# Patient Record
Sex: Female | Born: 1995 | Race: White | Hispanic: No | Marital: Single
Health system: Southern US, Community
[De-identification: ages and names within clinical notes are randomized; demographics above are authoritative.]

---
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
## 2018-08-25 ENCOUNTER — Other Ambulatory Visit: Payer: Self-pay

## 2018-08-25 ENCOUNTER — Ambulatory Visit: Payer: Medicaid Other | Attending: Urology | Admitting: Physical Therapy

## 2018-08-25 DIAGNOSIS — M6281 Muscle weakness (generalized): Secondary | ICD-10-CM | POA: Insufficient documentation

## 2018-08-25 DIAGNOSIS — R279 Unspecified lack of coordination: Secondary | ICD-10-CM | POA: Diagnosis present

## 2018-08-25 DIAGNOSIS — R252 Cramp and spasm: Secondary | ICD-10-CM | POA: Insufficient documentation

## 2018-08-25 NOTE — Therapy (Addendum)
[facility] [address], [address] [place], [place], [address] Phone: [phone]   Fax:  [phone]  Physical Therapy Evaluation  Patient Details  Name: Elaine Hebert MRN: [id number] Date of Birth: 05/03/1995 Referring Provider (PT): Crist FatHerrick, Benjamin W, MD   Encounter Date: 08/25/2018 PT End of Session - 08/25/18 0944    Visit Number  1    Date for PT Re-Evaluation  11/17/18    Authorization Type  MCAID    PT Start Time  0930    PT Stop Time  1012    PT Time Calculation (min)  42 min    Activity Tolerance  Patient tolerated treatment well;Patient limited by pain    Behavior During Therapy  [facility] for tasks assessed/performed         History reviewed. No pertinent past medical history.  History reviewed. No pertinent surgical history.  There were no vitals filed for this visit.  Subjective Assessment - 08/25/18 0934    Subjective  Pt has had pain in the urethra since the beginning of this year.  It has been worse with heat and in the summer.  had to stop working at [employer].  I always have to pee. Pt reports she feels like she has been under a lot of stress with a special needs daughter and her daugter's father committed suicide last year. She reports that she realizes this may be making her pain worse as well.   Currently in Pain?  Yes    Pain Score  3    up to 9/10 crying   Pain Location  Pelvis    Pain Orientation  Mid    Pain Descriptors / Indicators  Burning    Pain Type  Chronic pain    Pain Radiating Towards  urethra    Pain Onset  More than a month ago    Pain Frequency  Intermittent    Aggravating Factors   heat and running around after special needs daughter, soda    Effect of Pain on Daily Activities  sometimes can only lie down    Multiple Pain Sites  No      Patient Goals: stop having pain and be able to work and be intimate with her partner     [facility] PT Assessment - 08/28/18 0001      Assessment   Medical Diagnosis  R30.0 (ICD-10-CM) - Dysuria; R10.2 (ICD-10-CM) - Pelvic and perineal pain    Referring Provider (PT)  Crist FatHerrick, Benjamin W, MD    Onset Date/Surgical Date  --   early this year   Prior Therapy  No      Precautions   Precautions  None      Restrictions   Weight Bearing Restrictions  No      Balance Screen   Has the patient fallen in the past 6 months  No      Home Environment   Living Environment  Private residence    Living Arrangements  Spouse/significant other;Children   daughter 2320 mos old     Prior Function   Level of Independence  Independent    Vocation  Unemployed      Cognition   Overall Cognitive Status  Within Functional Limits for tasks assessed      Observation/Other Assessments   Observations  pt seems anxious; talking a lot and hard to keep on track      Posture/Postural Control   Posture/Postural Control  Postural limitations    Postural  Limitations  Anterior pelvic tilt      ROM / Strength   AROM / PROM / Strength  AROM;Strength      AROM   Overall AROM Comments  lumbar AROM normal      Strength   Overall Strength Comments  bilateral hip strength 4+/5 to 5/5 throughout      Flexibility   Soft Tissue Assessment /Muscle Length  yes    Hamstrings  60% bilateral      Palpation   Palpation comment  lumbar paraspinals tight; some weakness of lower abdomen decreased tension when asked to lift head and neck      Special Tests    Special Tests  Lumbar    Lumbar Tests  Straight Leg Raise      Straight Leg Raise   Findings  Negative      Ambulation/Gait   Gait Pattern  Within Functional Limits                Objective measurements completed on examination: See above findings.    Pelvic Floor Special Questions - 08/28/18 0001    Prior Pelvic/Prostate Exam  Yes    Are you Pregnant or attempting pregnancy?  No    Prior Pregnancies  Yes    Currently Sexually Active  Yes    Is this Painful  Yes    Marinoff  Scale  pain prevents any attempts at intercourse   50% less than I would want to    Urinary Leakage  Yes    Activities that cause leaking  --   drops after voiding   Urinary urgency  Yes    Urinary frequency  sometimes every 30 min when it is painful; denies nocturia    Fecal incontinence  --   constipation   Falling out feeling (prolapse)  No    Skin Integrity  --   bleeding noted - pt reports she is on her period   Perineal Body/Introitus   Normal    Prolapse  None    Pelvic Floor Internal Exam  pt identity confirmed and informed consent given to perform internal soft tissue assessment    Exam Type  Vaginal    Sensation  norm    Palpation  slightly more tender to pubococcygeus anterior attachments    Strength  good squeeze, good lift, able to hold agaisnt strong resistance    Strength # of reps  3    Strength # of seconds  4    Tone  high               PT Education - 08/28/18 1335    Education Details  educated on breathing and you tube - pelvic floor meditation    Person(s) Educated  Patient    Methods  Explanation    Comprehension  Verbalized understanding       PT Short Term Goals - 08/28/18 1400      PT SHORT TERM GOAL #1   Title  ind with toileting techniques    Time  3    Period  Weeks    Status  New    Target Date  09/15/18      PT SHORT TERM GOAL #2   Title  ind with initial HEP    Time  3    Period  Weeks    Status  New    Target Date  09/15/18        PT Long Term Goals - 08/28/18 1336  PT LONG TERM GOAL #1   Title  Pt will be ind with HEP in order to be able to manage symptoms at home    Baseline  does not know    Time  12    Period  Weeks    Status  New    Target Date  11/17/18      PT LONG TERM GOAL #2   Title  Pt will report mild pain of 1-2/10 at most during all daily activities    Baseline  up to 9/10    Time  12    Period  Weeks    Status  New    Target Date  11/17/18      PT LONG TERM GOAL #3   Title  Pt will be  able to relax and bulge pelvic floor along with understanding toilet techniques in order to void without increased pain    Baseline  burning with urnination and high tone pelvic floor unable to bulge    Time  12    Period  Weeks    Status  New    Target Date  11/17/18      PT LONG TERM GOAL #4   Title  Pt will report 1/3 Marinoff scale for improved functional activities    Baseline  3/3 on the [facility] scale    Time  12    Period  Weeks    Status  New    Target Date  11/17/18           Plan - 08/25/18 0951    Clinical Impression Statement  Pt presents to clnic due to pelvic pain of the urethra and feeling chronic sensations of UTI.  Pt mentioned a lot of stress in her life currently with a special needs child, recently having to quit her job due to pain, and her child's father committing suicide.  Pt states she can tell part of her problem is due to stress.  Pt has increased anterior hip rotation.  She has pelvic floor strength of 4/5 MMT but low endurance of 4 seconds.  Pt has high tone and unable to relax and bulge pelvic floor muscles. Pt has tension throughout LE with decreaesd hamstring flexibility and tight lumbar paraspinals. Pt has some core weakness with easier time performing straight leg raise when stabilizing pelvis. she is unable to activate her TrA enough to stabilize the pelvis when performing a straight leg raise. Pt will benefit from skilled PT to downtrain her pelvic floor and improve overall strength and posture as well as coordination needed for maximum functional activities.   Examination-Activity Limitations  Caring for Others;Continence    Stability/Clinical Decision Making  Evolving/Moderate complexity    Clinical Decision Making  Moderate    Rehab Potential  Good    PT Frequency  1x / week    PT Duration  12 weeks      Examination-Participation Restrictions -  Pt will benefit from skilled therapeutic intervention in order to improve on the following deficits  Increased muscle spasms; Decreased coordination; Decreased range of motion; Impaired flexibility; Postural dysfunction  Stability/Clinical Decision Making Evolving/Moderate complexity  Clinical Decision Making Moderate  Rehab Potential Good  PT Frequency 1x / week  PT Duration 12 weeks  PT Treatment/Interventions ADLs/Self Care Home Management; Biofeedback; Cryotherapy; Electrical Stimulation; Moist Heat; Therapeutic activities; Therapeutic exercise; Neuromuscular re-education; Patient/family education; Manual techniques; Taping; Dry needling; Passive range of motion  PT Next Visit Plan breathing and bulging pelvic floor, hip,  lumbar hamstring stretch, initiate HEP, toileting tequniques abdominal massage, biofeedback  PT Home Exercise Plan -  Recommended Other Services -  Consulted and Agree with Plan of Care Patient         Patient will benefit from skilled therapeutic intervention in order to improve the following deficits and impairments:  Increased muscle spasms, Decreased coordination, Decreased range of motion, Impaired flexibility, Postural dysfunction  Visit Diagnosis: 1. Cramp and spasm   2. Unspecified lack of coordination   3. Muscle weakness (generalized)        Problem List Patient Active Problem List   Diagnosis Date Noted  . [redacted] weeks gestation of pregnancy   . Abnormal fetal ultrasound   . Encounter for fetal anatomic survey   . Pregnancy complicated by fetal neurologic abnormality   . Poor fetal growth affecting management of mother in third trimester     Junious SilkJakki L Sinclair Arrazola, PT 08/28/2018, 2:13 PM  [facility] [address], [address] [place], [place], [address] Phone: [phone]   Fax:  [phone]  Name: Elaine Hebert MRN: [id number] Date of Birth: 02/16/1995

## 2018-08-28 ENCOUNTER — Encounter: Payer: Self-pay | Admitting: Physical Therapy

## 2018-08-28 NOTE — Addendum Note (Signed)
Addended by: Su Hoff on: 08/28/2018 02:16 PM   Modules accepted: Orders

## 2018-09-01 ENCOUNTER — Ambulatory Visit: Payer: Medicaid Other | Admitting: Physical Therapy

## 2018-09-01 ENCOUNTER — Other Ambulatory Visit: Payer: Self-pay

## 2018-09-01 DIAGNOSIS — M6281 Muscle weakness (generalized): Secondary | ICD-10-CM

## 2018-09-01 DIAGNOSIS — R279 Unspecified lack of coordination: Secondary | ICD-10-CM

## 2018-09-01 DIAGNOSIS — R252 Cramp and spasm: Secondary | ICD-10-CM | POA: Diagnosis not present

## 2018-09-01 NOTE — Therapy (Signed)
[facility] [address], [place], [place], [address] Phone: [phone]   Fax:  [phone]  Physical Therapy Treatment  Patient Details  Name: Elaine Hebert MRN: [id number] Date of Birth: [birth year] Referring Provider (PT): Ardis Hughs, MD   Encounter Date: 09/01/2018  PT End of Session - 09/01/18 1029    Visit Number  2    Date for PT Re-Evaluation  11/17/18    Authorization Type  MCAID    PT Start Time  1025   pt arrived late   PT Stop Time  1105    PT Time Calculation (min)  40 min    Activity Tolerance  Patient tolerated treatment well;Patient limited by pain    Behavior During Therapy  [facility] for tasks assessed/performed       No past medical history on file.  No past surgical history on file.  There were no vitals filed for this visit.  Subjective Assessment - 09/01/18 1030    Subjective  Pt states she has been using only mobic and valume for pain relief.    Pertinent History  goes by Elaine Hebert    Limitations  Sitting    Currently in Pain?  Yes                       [name] - 09/01/18 0001      Neuro Re-ed    Neuro Re-ed Details   educated and performed diaphragmatic breathing throughout stretches      Exercises   Exercises  Lumbar      Lumbar Exercises: Stretches   Active Hamstring Stretch  Right;Left;3 reps;20 seconds    Double Knee to Chest Stretch Limitations  happy baby stretch - 2 min with breathing    Lower Trunk Rotation  5 reps    Hip Flexor Stretch Limitations  hip adduction with strap and - 3x 20 sec    Press Ups Limitations  press up to forearms - 5 x 10 sec    Piriformis Stretch  Right;Left;3 reps;30 seconds    Other Lumbar Stretch Exercise  throracic rotation sidelying - 5 x 10 sec each side             PT Education - 09/01/18 1102    Education Details  Access Code: [email or web address]    Person(s) Educated  Patient    Methods   Explanation;Demonstration;Handout;Verbal cues;Tactile cues    Comprehension  Verbalized understanding;Returned demonstration       PT Short Term Goals - 09/01/18 1112      PT SHORT TERM GOAL #1   Title  ind with toileting techniques    Status  On-going      PT SHORT TERM GOAL #2   Title  ind with initial HEP    Status  On-going        PT Long Term Goals - 08/28/18 1336      PT LONG TERM GOAL #1   Title  Pt will be ind with HEP in order to be able to manage symptoms at home    Baseline  does not know    Time  12    Period  Weeks    Status  New    Target Date  11/17/18      PT LONG TERM GOAL #2   Title  Pt will report mild pain of 1-2/10 at most during all daily activities    Baseline  up to 9/10  Time  12    Period  Weeks    Status  New    Target Date  11/17/18      PT LONG TERM GOAL #3   Title  Pt will be able to relax and bulge pelvic floor along with understanding toilet techniques in order to void without increased pain    Baseline  burning with urnination and high tone pelvic floor unable to bulge    Time  12    Period  Weeks    Status  New    Target Date  11/17/18      PT LONG TERM GOAL #4   Title  Pt will report 1/3 Marinoff scale for improved functional activities    Baseline  3/3 on the [facility] scale    Time  12    Period  Weeks    Status  New    Target Date  11/17/18            Plan - 09/01/18 1109    Clinical Impression Statement  Pt was able to perform stretches with breathing after moderate amount of cues and education on how to breath correctly.  She needed cues to breathe more slowly on the inhale.  Pt reports feeling better at the end of the treatment.  She continues to need skilled PT in order to porgress the soft tissue length so we can successfully begin strength and endurance of the pelvic floor.    PT Treatment/Interventions  ADLs/Self Care Home Management;Biofeedback;Cryotherapy;Electrical Stimulation;Moist Heat;Therapeutic  activities;Therapeutic exercise;Neuromuscular re-education;Patient/family education;Manual techniques;Taping;Dry needling;Passive range of motion    PT Next Visit Plan  internal STM and discuss self massage with wand, toileting techniques, abdominal massage    PT Home Exercise Plan  Access Code: [email or web address]    Consulted and Agree with Plan of Care  Patient       Patient will benefit from skilled therapeutic intervention in order to improve the following deficits and impairments:  Increased muscle spasms, Decreased coordination, Decreased range of motion, Impaired flexibility, Postural dysfunction  Visit Diagnosis: 1. Cramp and spasm   2. Unspecified lack of coordination   3. Muscle weakness (generalized)        Problem List Patient Active Problem List   Diagnosis Date Noted  . [redacted] weeks gestation of pregnancy   . Abnormal fetal ultrasound   . Encounter for fetal anatomic survey   . Pregnancy complicated by fetal neurologic abnormality   . Poor fetal growth affecting management of mother in third trimester     Elaine Hebert, PT 09/01/2018, 11:12 AM  [facility] [address], [address] [place], [place], [address] Phone: [phone]   Fax:  [phone]  Name: Elaine Hebert MRN: [id number] Date of Birth: 03/28/1995

## 2018-09-01 NOTE — Patient Instructions (Signed)
Access Code: [email or web address]  URL: [email or web address]  Date: 09/01/2018  Prepared by: Jari Favre   Exercises  Supine Figure 4 Piriformis Stretch - 3 reps - 1 sets - 30 sec hold - 1x daily - 7x weekly  Hooklying Hamstring Stretch with Strap - 3 reps - 1 sets - 30 sec hold - 1x daily - 7x weekly  Supine Butterfly Groin Stretch - 3 reps - 1 sets - 30 sec hold - 1x daily - 7x weekly  Hip Adductors and Hamstring Stretch with Strap - 3 reps - 1 sets - 30 sec hold - 1x daily - 7x weekly  Sidelying Thoracic Lumbar Rotation - 5 reps - 1 sets - 10 sec hold - 1x daily - 7x weekly  Supine Diaphragmatic Breathing with Pelvic Floor Lengthening - 10 reps - 1 sets - 3x daily - 7x weekly

## 2018-09-08 ENCOUNTER — Encounter: Payer: Self-pay | Admitting: Physical Therapy

## 2018-09-08 ENCOUNTER — Other Ambulatory Visit: Payer: Self-pay

## 2018-09-08 ENCOUNTER — Ambulatory Visit: Payer: Medicaid Other | Admitting: Physical Therapy

## 2018-09-08 DIAGNOSIS — R252 Cramp and spasm: Secondary | ICD-10-CM | POA: Diagnosis not present

## 2018-09-08 DIAGNOSIS — M6281 Muscle weakness (generalized): Secondary | ICD-10-CM

## 2018-09-08 DIAGNOSIS — R279 Unspecified lack of coordination: Secondary | ICD-10-CM

## 2018-09-08 NOTE — Patient Instructions (Signed)
Access Code: [email or web address]  URL: [email or web address]  Date: 09/08/2018  Prepared by: Jari Favre   Exercises  Supine Figure 4 Piriformis Stretch - 3 reps - 1 sets - 30 sec hold - 1x daily - 7x weekly  Hooklying Hamstring Stretch with Strap - 3 reps - 1 sets - 30 sec hold - 1x daily - 7x weekly  Supine Butterfly Groin Stretch - 3 reps - 1 sets - 30 sec hold - 1x daily - 7x weekly  Hip Adductors and Hamstring Stretch with Strap - 3 reps - 1 sets - 30 sec hold - 1x daily - 7x weekly  Sidelying Thoracic Lumbar Rotation - 5 reps - 1 sets - 10 sec hold - 1x daily - 7x weekly  Supine Diaphragmatic Breathing with Pelvic Floor Lengthening - 10 reps - 1 sets - 3x daily - 7x weekly  Cat-Camel to Child's Pose - 5 reps - 1 sets - 10 sec hold - 1x daily - 7x weekly  Quadruped Pelvic Floor Contraction with Weight Shift Forward Backward - 10 reps - 3 sets - 1x daily - 7x weekly

## 2018-09-08 NOTE — Therapy (Signed)
[facility] [address] [place], [place], [address] Phone: [phone]   Fax:  [phone]  Physical Therapy Treatment  Patient Details  Name: Elaine Hebert MRN: [id number] Date of Birth: Feb 27, 1995 Referring Provider (PT): Ardis Hughs, MD   Encounter Date: 09/08/2018  PT End of Session - 09/08/18 1148    Visit Number  3    Date for PT Re-Evaluation  11/17/18    Authorization Type  MCAID    Authorization - Visit Number  2    Authorization - Number of Visits  3    PT Start Time  1106    PT Stop Time  1148    PT Time Calculation (min)  42 min    Activity Tolerance  Patient tolerated treatment well    Behavior During Therapy  [facility] for tasks assessed/performed       History reviewed. No pertinent past medical history.  History reviewed. No pertinent surgical history.  There were no vitals filed for this visit.  Subjective Assessment - 09/08/18 1108    Subjective  I feel about the same since the last time.  Lately pain hasn't been happening during the day.  Pain when using the bathroom, that still hurts really bad sometimes.  When I leave here I feel better and I can empty my bladder more    Pertinent History  goes by Elaine Hebert    Patient Stated Goals  stop having pain and be able to work and be intimate with her partner    Currently in Pain?  No/denies                       [facility] Adult PT Treatment/Exercise - 09/08/18 0001      Neuro Re-ed    Neuro Re-ed Details   seated on ball, bouncing and circles to relax pelvic floor      Lumbar Exercises: Stretches   Other Lumbar Stretch Exercise  child's pose and cobra iwth breathing into pelvic floor      Lumbar Exercises: Quadruped   Madcat/Old Horse  10 reps    Other Quadruped Lumbar Exercises  fwd/back rocking, breathing      Manual Therapy   Manual Therapy  Soft tissue mobilization;Myofascial release    Soft tissue mobilization  lumbar  and low thoracic  lumbar paraspinals Rt>Lt    Myofascial Release  thoracolumbar fascial from sacrum up to thoracic region             PT Education - 09/08/18 1147    Education Details  Access Code: [email or web address]    Person(s) Educated  Patient    Methods  Explanation;Demonstration;Handout;Verbal cues;Tactile cues    Comprehension  Verbalized understanding;Returned demonstration       PT Short Term Goals - 09/01/18 1112      PT SHORT TERM GOAL #1   Title  ind with toileting techniques    Status  On-going      PT SHORT TERM GOAL #2   Title  ind with initial HEP    Status  On-going        PT Long Term Goals - 08/28/18 1336      PT LONG TERM GOAL #1   Title  Pt will be ind with HEP in order to be able to manage symptoms at home    Baseline  does not know    Time  12    Period  Weeks    Status  New    Target Date  11/17/18      PT LONG TERM GOAL #2   Title  Pt will report mild pain of 1-2/10 at most during all daily activities    Baseline  up to 9/10    Time  12    Period  Weeks    Status  New    Target Date  11/17/18      PT LONG TERM GOAL #3   Title  Pt will be able to relax and bulge pelvic floor along with understanding toilet techniques in order to void without increased pain    Baseline  burning with urnination and high tone pelvic floor unable to bulge    Time  12    Period  Weeks    Status  New    Target Date  11/17/18      PT LONG TERM GOAL #4   Title  Pt will report 1/3 Marinoff scale for improved functional activities    Baseline  3/3 on the [facility] scale    Time  12    Period  Weeks    Status  New    Target Date  11/17/18            Plan - 09/08/18 1203    Clinical Impression Statement  Patient had no pain today.  She is haivng an easier time emptying her bladder.  Pt did well with stretches and adding some gentle core and pelvic floor contractions.  Pt will benefit from skilled PT to work on improved soft tissue length since she did have  some large trigger points throughout Rt lumbar paraspinals.    PT Treatment/Interventions  ADLs/Self Care Home Management;Biofeedback;Cryotherapy;Electrical Stimulation;Moist Heat;Therapeutic activities;Therapeutic exercise;Neuromuscular re-education;Patient/family education;Manual techniques;Taping;Dry needling;Passive range of motion    PT Next Visit Plan  re-assess for re-auth CCME, internal STM and discuss self massage with wand, toileting techniques, abdominal massage    PT Home Exercise Plan  Access Code: [email or web address]    Consulted and Agree with Plan of Care  Patient       Patient will benefit from skilled therapeutic intervention in order to improve the following deficits and impairments:  Increased muscle spasms, Decreased coordination, Decreased range of motion, Impaired flexibility, Postural dysfunction  Visit Diagnosis: Cramp and spasm  Unspecified lack of coordination  Muscle weakness (generalized)     Problem List Patient Active Problem List   Diagnosis Date Noted  . [redacted] weeks gestation of pregnancy   . Abnormal fetal ultrasound   . Encounter for fetal anatomic survey   . Pregnancy complicated by fetal neurologic abnormality   . Poor fetal growth affecting management of mother in third trimester     Junious SilkJakki L Quinton Voth, PT 09/08/2018, 12:17 PM  [facility] [address] [place], [place], [address] Phone: [phone]   Fax:  [phone]  Name: Elaine Guarnerimber Diss MRN: [id number] Date of Birth: 01/25/1995

## 2018-09-15 ENCOUNTER — Other Ambulatory Visit: Payer: Self-pay

## 2018-09-15 ENCOUNTER — Ambulatory Visit: Payer: Medicaid Other | Admitting: Physical Therapy

## 2018-09-15 DIAGNOSIS — M6281 Muscle weakness (generalized): Secondary | ICD-10-CM

## 2018-09-15 DIAGNOSIS — R252 Cramp and spasm: Secondary | ICD-10-CM

## 2018-09-15 DIAGNOSIS — R279 Unspecified lack of coordination: Secondary | ICD-10-CM

## 2018-09-15 NOTE — Therapy (Signed)
[facility] [address], [address] [place], [place], [address] Phone: [phone]   Fax:  [phone]  Physical Therapy Treatment  Patient Details  Name: Elaine Hebert MRN: [id number] Date of Birth: 01/06/1996 Referring Provider (PT): Elaine FatHerrick, Benjamin W, MD   Encounter Date: 09/15/2018  PT End of Session - 09/15/18 1109    Visit Number  4    Date for PT Re-Evaluation  11/17/18    Authorization Type  MCAID    Authorization - Visit Number  3    Authorization - Number of Visits  3    PT Start Time  1107    PT Stop Time  1145    PT Time Calculation (min)  38 min    Activity Tolerance  Patient tolerated treatment well    Behavior During Therapy  [facility] for tasks assessed/performed       No past medical history on file.  No past surgical history on file.  There were no vitals filed for this visit.  Subjective Assessment - 09/15/18 1111    Subjective  I feel like this is helping.  Feeling better than last week.    Pertinent History  goes by [name]    Patient Stated Goals  stop having pain and be able to work and be intimate with her partner    Currently in Pain?  Yes    Pain Score  1     Pain Location  Abdomen    Pain Orientation  Lower    Pain Descriptors / Indicators  --   urgency   Pain Type  Chronic pain    Pain Onset  More than a month ago    Pain Frequency  Intermittent         [facility] PT Assessment - 09/15/18 0001      Strength   Overall Strength Comments  bilateral hip strength 4+/5 to 5/5 throughout      Flexibility   Hamstrings  60% bilateral                Pelvic Floor Special Questions - 09/15/18 0001    Pelvic Floor Internal Exam  pt identity confirmed and informed consent given to perform internal soft tissue assessment and treatment   Exam Type  Vaginal    Palpation  no tenderness    Strength  good squeeze, good lift, able to hold agaisnt strong resistance    Tone  high but able to  control with breathing and cues        [facility] Adult PT Treatment/Exercise - 09/15/18 0001      Self-Care   Self-Care  Other Self-Care Comments    Other Self-Care Comments   educated on using dilator or similar device  to work on relaxing with penetration      Manual Therapy   Soft tissue mobilization  pubococcygeus, bulbocavernosis    Myofascial Release  thoracolumbar fascial from sacrum up to thoracic region, lower abdomen gentle mobs around urethra               PT Short Term Goals - 09/15/18 1110      PT SHORT TERM GOAL #1   Title  ind with toileting techniques    Status  Achieved      PT SHORT TERM GOAL #2   Title  ind with initial HEP    Status  Achieved        PT Long Term Goals - 09/15/18 1111  PT LONG TERM GOAL #1   Title  Pt will be ind with HEP in order to be able to manage symptoms at home    Baseline  working on learning more but it is helping    Status  On-going      PT LONG TERM GOAL #2   Title  Pt will report mild pain of 1-2/10 at most during all daily activities    Baseline  still can be up to 9/10 but today is 1/10; feels like 80% better since starting      PT LONG TERM GOAL #3   Title  Pt will be able to relax and bulge pelvic floor along with understanding toilet techniques in order to void without increased pain    Baseline  burning with urnination still happens but stream is more straight and able to relax more    Status  On-going      PT LONG TERM GOAL #4   Title  Pt will report 1/3 Marinoff scale for improved functional activities    Baseline  1/3 lately but not all the time, feels like burning    Status  On-going            Plan - 09/15/18 1147    Clinical Impression Statement  Pt is able to relax and bulge her muscles now.  She is urinating much more easily and pain has been well managed this week.  She has had chronic pain which will take more time to manage on her own.  At this time she continues to have some trigger  points througout her lumbar and thoracic spine.  She has some high tone of the pelvic floor but releaxes with cues.  Pt will benefit from skilled PT to work on muscle coordination during functional movements.    PT Treatment/Interventions  ADLs/Self Care Home Management;Biofeedback;Cryotherapy;Electrical Stimulation;Moist Heat;Therapeutic activities;Therapeutic exercise;Neuromuscular re-education;Patient/family education;Manual techniques;Taping;Dry needling;Passive range of motion    PT Next Visit Plan  f/u on self massage to work on staying relaxed with penetration, abdominal massage, biofeedback for muscle coordination during functional movements    PT Home Exercise Plan  Access Code: [email or web address]    Consulted and Agree with Plan of Care  Patient       Patient will benefit from skilled therapeutic intervention in order to improve the following deficits and impairments:  Increased muscle spasms, Decreased coordination, Decreased range of motion, Impaired flexibility, Postural dysfunction  Visit Diagnosis: Cramp and spasm  Unspecified lack of coordination  Muscle weakness (generalized)     Problem List Patient Active Problem List   Diagnosis Date Noted  . [redacted] weeks gestation of pregnancy   . Abnormal fetal ultrasound   . Encounter for fetal anatomic survey   . Pregnancy complicated by fetal neurologic abnormality   . Poor fetal growth affecting management of mother in third trimester     Elaine Hebert, PT 09/15/2018, 5:38 PM  [facility] [address], [place], [place], [address] Phone: [phone]   Fax:  [phone]  Name: Elaine Hebert MRN: [id number] Date of Birth: 01/18/1996

## 2018-09-22 ENCOUNTER — Other Ambulatory Visit: Payer: Self-pay

## 2018-09-22 ENCOUNTER — Ambulatory Visit: Payer: Medicaid Other | Attending: Urology | Admitting: Physical Therapy

## 2018-09-22 DIAGNOSIS — M6281 Muscle weakness (generalized): Secondary | ICD-10-CM | POA: Diagnosis present

## 2018-09-22 DIAGNOSIS — R279 Unspecified lack of coordination: Secondary | ICD-10-CM | POA: Diagnosis present

## 2018-09-22 DIAGNOSIS — R252 Cramp and spasm: Secondary | ICD-10-CM | POA: Diagnosis present

## 2018-09-22 NOTE — Therapy (Signed)
[facility] [address], [place], [place], [address] Phone: [phone]   Fax:  [phone]  Physical Therapy Treatment  Patient Details  Name: Elaine Hebert MRN: [id number] Date of Birth: 1995/03/04 Referring Provider (PT): Ardis Hughs, MD   Encounter Date: 09/22/2018  PT End of Session - 09/22/18 1054    Visit Number  5    Date for PT Re-Evaluation  11/17/18    Authorization Type  MCAID    Authorization - Visit Number  1    Authorization - Number of Visits  8    PT Start Time  0258   Pt late   PT Stop Time  1057    PT Time Calculation (min)  35 min    Activity Tolerance  Patient tolerated treatment well    Behavior During Therapy  [facility] for tasks assessed/performed       No past medical history on file.  No past surgical history on file.  There were no vitals filed for this visit.  Subjective Assessment - 09/22/18 1030    Subjective  I was able to massage the vaginal canal.  I would describe pain as 1/10 today and less pain with urination.    Patient Stated Goals  stop having pain and be able to work and be intimate with her partner    Currently in Pain?  Yes    Pain Score  1     Pain Location  Abdomen    Pain Orientation  Lower    Pain Descriptors / Indicators  Aching    Pain Type  Chronic pain    Pain Onset  More than a month ago                       [facility] Adult PT Treatment/Exercise - 09/22/18 0001      Neuro Re-ed    Neuro Re-ed Details   TrA activation and correct diaphragmatic breathing      Lumbar Exercises: Stretches   Active Hamstring Stretch  Right;Left;3 reps;20 seconds      Lumbar Exercises: Aerobic   Elliptical  L1 incline 3; 5 min forward   PT present to get status update- cues to engage core     Lumbar Exercises: Supine   Ab Set  10 reps;2 seconds    Clam  15 reps    Bent Knee Raise  20 reps;2 seconds    Other Supine Lumbar Exercises  double knee out  to the side - TrA engaged - 15x      Lumbar Exercises: Quadruped   Other Quadruped Lumbar Exercises  rocking from frog leg stretch to child pose with breathing  to engage core               PT Short Term Goals - 09/15/18 1110      PT SHORT TERM GOAL #1   Title  ind with toileting techniques    Status  Achieved      PT SHORT TERM GOAL #2   Title  ind with initial HEP    Status  Achieved        PT Long Term Goals - 09/15/18 1111      PT LONG TERM GOAL #1   Title  Pt will be ind with HEP in order to be able to manage symptoms at home    Baseline  working on learning more but it is helping    Status  On-going  PT LONG TERM GOAL #2   Title  Pt will report mild pain of 1-2/10 at most during all daily activities    Baseline  still can be up to 9/10 but today is 1/10; feels like 80% better since starting      PT LONG TERM GOAL #3   Title  Pt will be able to relax and bulge pelvic floor along with understanding toilet techniques in order to void without increased pain    Baseline  burning with urnination still happens but stream is more straight and able to relax more    Status  On-going      PT LONG TERM GOAL #4   Title  Pt will report 1/3 Marinoff scale for improved functional activities    Baseline  1/3 lately but not all the time, feels like burning    Status  On-going            Plan - 09/22/18 1101    Clinical Impression Statement  Pt arrived late today.  She did well with addition of core strength today.  Pt had difficulty stabilizing pelvis during the exercises but she gained more control towards the end of treatment.  She was able to activate the abdominal and TrA with verbal and tactile cues.  Pt will benefit from skilled PT to continue to progress muscle coordination and and strength for improved function with reduction of pain.    PT Treatment/Interventions  ADLs/Self Care Home Management;Biofeedback;Cryotherapy;Electrical Stimulation;Moist  Heat;Therapeutic activities;Therapeutic exercise;Neuromuscular re-education;Patient/family education;Manual techniques;Taping;Dry needling;Passive range of motion    PT Next Visit Plan  f/u on how she felt with additional exercises in HEP, progress core and strength as tolerated, lumbar and hip stretches,  biofeedback for muscle coordination if needed    PT Home Exercise Plan  Access Code: [email or web address]    Consulted and Agree with Plan of Care  Patient       Patient will benefit from skilled therapeutic intervention in order to improve the following deficits and impairments:  Increased muscle spasms, Decreased coordination, Decreased range of motion, Impaired flexibility, Postural dysfunction  Visit Diagnosis: Cramp and spasm  Unspecified lack of coordination  Muscle weakness (generalized)     Problem List Patient Active Problem List   Diagnosis Date Noted  . [redacted] weeks gestation of pregnancy   . Abnormal fetal ultrasound   . Encounter for fetal anatomic survey   . Pregnancy complicated by fetal neurologic abnormality   . Poor fetal growth affecting management of mother in third trimester     Junious SilkJakki L , PT 09/22/2018, 2:13 PM  [facility] [address] [place], [place], [address] Phone: [phone]   Fax:  [phone]  Name: Anette Guarnerimber Finks MRN: [id number] Date of Birth: 03/21/1995

## 2018-09-29 ENCOUNTER — Other Ambulatory Visit: Payer: Self-pay

## 2018-09-29 ENCOUNTER — Encounter: Payer: Self-pay | Admitting: Physical Therapy

## 2018-09-29 ENCOUNTER — Ambulatory Visit: Payer: Medicaid Other | Admitting: Physical Therapy

## 2018-09-29 DIAGNOSIS — R252 Cramp and spasm: Secondary | ICD-10-CM | POA: Diagnosis not present

## 2018-09-29 DIAGNOSIS — R279 Unspecified lack of coordination: Secondary | ICD-10-CM

## 2018-09-29 DIAGNOSIS — M6281 Muscle weakness (generalized): Secondary | ICD-10-CM

## 2018-09-29 NOTE — Therapy (Signed)
[facility] [address], [place], [place], [address] Phone: [phone]   Fax:  [phone]  Physical Therapy Treatment  Patient Details  Name: Elaine Hebert MRN: [id number] Date of Birth: 1996-01-07 Referring Provider (PT): Ardis Hughs, MD   Encounter Date: 09/29/2018  PT End of Session - 09/29/18 1026    Visit Number  6    Date for PT Re-Evaluation  11/17/18    Authorization Type  MCAID    Authorization - Visit Number  2    Authorization - Number of Visits  8    PT Start Time  1026    PT Stop Time  1057    PT Time Calculation (min)  31 min    Activity Tolerance  Patient tolerated treatment well    Behavior During Therapy  [facility] for tasks assessed/performed       History reviewed. No pertinent past medical history.  History reviewed. No pertinent surgical history.  There were no vitals filed for this visit.  Subjective Assessment - 09/29/18 1029    Subjective  Pt arrived late today and reports she was tired. Pt states she is even better than last time and is having no pain.    Pertinent History  goes by Elaine Hebert    Patient Stated Goals  stop having pain and be able to work and be intimate with her partner    Currently in Pain?  No/denies                       [facility] Adult PT Treatment/Exercise - 09/29/18 0001      Lumbar Exercises: Aerobic   Elliptical  --   PT present to get status update- cues to engage core   Nustep  L3 x 6 min    PT present for status update     Lumbar Exercises: Standing   Row  Strengthening;Power tower;Both;20 reps    Row Limitations  15lb    Shoulder Extension  Strengthening;Power Tower;Both;20 reps    Shoulder Extension Limitations  1 plate      Lumbar Exercises: Supine   Other Supine Lumbar Exercises  foam roller under lumbar - rocking and single leg kickout with hip flexor stretch    Other Supine Lumbar Exercises  lying with foam roll lengthways -  alt UE overhead, alt knee drop outs - 30x each cues to keep pelvis neutral       shoulder diagonals - 1 plate - 10X each        PT Short Term Goals - 09/15/18 1110      PT SHORT TERM GOAL #1   Title  ind with toileting techniques    Status  Achieved      PT SHORT TERM GOAL #2   Title  ind with initial HEP    Status  Achieved        PT Long Term Goals - 09/15/18 1111      PT LONG TERM GOAL #1   Title  Pt will be ind with HEP in order to be able to manage symptoms at home    Baseline  working on learning more but it is helping    Status  On-going      PT LONG TERM GOAL #2   Title  Pt will report mild pain of 1-2/10 at most during all daily activities    Baseline  still can be up to 9/10 but today is 1/10; feels like  80% better since starting      PT LONG TERM GOAL #3   Title  Pt will be able to relax and bulge pelvic floor along with understanding toilet techniques in order to void without increased pain    Baseline  burning with urnination still happens but stream is more straight and able to relax more    Status  On-going      PT LONG TERM GOAL #4   Title  Pt will report 1/3 Marinoff scale for improved functional activities    Baseline  1/3 lately but not all the time, feels like burning    Status  On-going            Plan - 09/29/18 1051    Clinical Impression Statement  Pt has had reduced symptoms overall 75% . Sometimes urinating still hurts . Pt continues to demonstrate improvements with increased difficutly of core strength.  She needs cues for improved posture and body awareness during exercises.  Pt will benefit from skilled PT to continue working on strength and pain managemnt    PT Treatment/Interventions  ADLs/Self Care Home Management;Biofeedback;Cryotherapy;Electrical Stimulation;Moist Heat;Therapeutic activities;Therapeutic exercise;Neuromuscular re-education;Patient/family education;Manual techniques;Taping;Dry needling;Passive range of motion     PT Next Visit Plan  continue core and posture strengthening and flexibility    PT Home Exercise Plan  Access Code: [email or web address]    Consulted and Agree with Plan of Care  Patient       Patient will benefit from skilled therapeutic intervention in order to improve the following deficits and impairments:  Increased muscle spasms, Decreased coordination, Decreased range of motion, Impaired flexibility, Postural dysfunction  Visit Diagnosis: Cramp and spasm  Unspecified lack of coordination  Muscle weakness (generalized)     Problem List Patient Active Problem List   Diagnosis Date Noted  . [redacted] weeks gestation of pregnancy   . Abnormal fetal ultrasound   . Encounter for fetal anatomic survey   . Pregnancy complicated by fetal neurologic abnormality   . Poor fetal growth affecting management of mother in third trimester     Junious SilkJakki L Pressley Tadesse, PT 09/29/2018, 11:00 AM  [facility] [address], [address] [place], [place], [address] Phone: [phone]   Fax:  [phone]  Name: Elaine Hebert MRN: [id number] Date of Birth: 04/06/1995

## 2018-10-06 ENCOUNTER — Ambulatory Visit: Payer: Medicaid Other | Admitting: Physical Therapy

## 2018-10-06 ENCOUNTER — Other Ambulatory Visit: Payer: Self-pay

## 2018-10-06 ENCOUNTER — Encounter: Payer: Self-pay | Admitting: Physical Therapy

## 2018-10-06 DIAGNOSIS — M6281 Muscle weakness (generalized): Secondary | ICD-10-CM

## 2018-10-06 DIAGNOSIS — R252 Cramp and spasm: Secondary | ICD-10-CM

## 2018-10-06 DIAGNOSIS — R279 Unspecified lack of coordination: Secondary | ICD-10-CM

## 2018-10-06 NOTE — Therapy (Signed)
[facility] [address] [place], [place], [address] Phone: [phone]   Fax:  [phone]  Physical Therapy Treatment  Patient Details  Name: Elaine Hebert MRN: [id number] Date of Birth: [birth year] Referring Provider (PT): Ardis Hughs, MD   Encounter Date: 10/06/2018  PT End of Session - 10/06/18 1105    Visit Number  7    Date for PT Re-Evaluation  11/17/18    Authorization Type  MCAID    PT Start Time  1024    PT Stop Time  1102    PT Time Calculation (min)  38 min    Activity Tolerance  Patient tolerated treatment well    Behavior During Therapy  [facility] for tasks assessed/performed       History reviewed. No pertinent past medical history.  History reviewed. No pertinent surgical history.  There were no vitals filed for this visit.  Subjective Assessment - 10/06/18 1029    Subjective  Pt states she has been having one bad day out of the week, but denies pain today.  Pt states it usually happens on stressful days.    Pertinent History  goes by Elaine Hebert    Patient Stated Goals  stop having pain and be able to work and be intimate with her partner    Currently in Pain?  No/denies                       [facility] Adult PT Treatment/Exercise - 10/06/18 0001      Neuro Re-ed    Neuro Re-ed Details   exercises and bouncing on      Lumbar Exercises: Aerobic   Nustep  L3 x 6 min    PT present for status update     Lumbar Exercises: Standing   Row  Strengthening;Power tower;Both;20 reps    Theraband Level (Row)  Level 3 (Green)    Shoulder Extension  Strengthening;Power Tower;Both;20 reps    Theraband Level (Shoulder Extension)  Level 3 (Green)    Other Standing Lumbar Exercises  wall push up on red ball      Lumbar Exercises: Seated   Long Arc Quad on [place]  Strengthening;Both;20 reps   coordination of pelvic floor and LE movements            PT Education - 10/06/18 1104    Education Details  Access Code: [email or web address]    Person(s) Educated  Patient    Methods  Explanation;Demonstration;Handout;Verbal cues    Comprehension  Verbalized understanding;Returned demonstration       PT Short Term Goals - 09/15/18 1110      PT SHORT TERM GOAL #1   Title  ind with toileting techniques    Status  Achieved      PT SHORT TERM GOAL #2   Title  ind with initial HEP    Status  Achieved        PT Long Term Goals - 09/15/18 1111      PT LONG TERM GOAL #1   Title  Pt will be ind with HEP in order to be able to manage symptoms at home    Baseline  working on learning more but it is helping    Status  On-going      PT LONG TERM GOAL #2   Title  Pt will report mild pain of 1-2/10 at most during all daily activities    Baseline  still can be up to 9/10 but  today is 1/10; feels like 80% better since starting      PT LONG TERM GOAL #3   Title  Pt will be able to relax and bulge pelvic floor along with understanding toilet techniques in order to void without increased pain    Baseline  burning with urnination still happens but stream is more straight and able to relax more    Status  On-going      PT LONG TERM GOAL #4   Title  Pt will report 1/3 Marinoff scale for improved functional activities    Baseline  1/3 lately but not all the time, feels like burning    Status  On-going            Plan - 10/06/18 1136    Clinical Impression Statement  Today's session focused on postural strength.  Pt tolerates exercises well.  She needs cues to keep shoulders down and back.  She felt good doing the pec stretches.  Pt was able to add to HEP today.  She will continue to benefit from skilled PT to work on imporved core strength for reduced pain with functional activities.    PT Treatment/Interventions  ADLs/Self Care Home Management;Biofeedback;Cryotherapy;Electrical Stimulation;Moist Heat;Therapeutic activities;Therapeutic exercise;Neuromuscular re-education;Patient/family  education;Manual techniques;Taping;Dry needling;Passive range of motion    PT Next Visit Plan  continue core and posture strengthening and flexibility    PT Home Exercise Plan  Access Code: [email or web address]    Recommended Other Services  initial cert signed    Consulted and Agree with Plan of Care  Patient       Patient will benefit from skilled therapeutic intervention in order to improve the following deficits and impairments:  Increased muscle spasms, Decreased coordination, Decreased range of motion, Impaired flexibility, Postural dysfunction  Visit Diagnosis: Cramp and spasm  Unspecified lack of coordination  Muscle weakness (generalized)     Problem List Patient Active Problem List   Diagnosis Date Noted  . [redacted] weeks gestation of pregnancy   . Abnormal fetal ultrasound   . Encounter for fetal anatomic survey   . Pregnancy complicated by fetal neurologic abnormality   . Poor fetal growth affecting management of mother in third trimester     Elaine Hebert, PT 10/06/2018, 11:47 AM  [facility] [address] [place], [place], [address] Phone: [phone]   Fax:  [phone]  Name: Elaine Hebert MRN: [id number] Date of Birth: 01/01/1996

## 2018-10-06 NOTE — Patient Instructions (Signed)
Access Code: [email or web address]  URL: [email or web address]  Date: 10/06/2018  Prepared by: Jari Favre   Exercises  Supine Figure 4 Piriformis Stretch - 3 reps - 1 sets - 30 sec hold - 1x daily - 7x weekly  Hooklying Hamstring Stretch with Strap - 3 reps - 1 sets - 30 sec hold - 1x daily - 7x weekly  Supine Butterfly Groin Stretch - 3 reps - 1 sets - 30 sec hold - 1x daily - 7x weekly  Hip Adductors and Hamstring Stretch with Strap - 3 reps - 1 sets - 30 sec hold - 1x daily - 7x weekly  Sidelying Thoracic Lumbar Rotation - 5 reps - 1 sets - 10 sec hold - 1x daily - 7x weekly  Supine Diaphragmatic Breathing with Pelvic Floor Lengthening - 10 reps - 1 sets - 3x daily - 7x weekly  Cat-Camel to Child's Pose - 5 reps - 1 sets - 10 sec hold - 1x daily - 7x weekly  Quadruped Pelvic Floor Contraction with Weight Shift Forward Backward - 10 reps - 3 sets - 1x daily - 7x weekly  Quadruped Adductor Stretch - 3 reps - 1 sets - 20 sec hold - 1x daily - 7x weekly  Child's Pose with Thread the Needle - 3 reps - 1 sets - 1x daily - 7x weekly  Supine Transversus Abdominis Bracing - Hands on Stomach - 10 reps - 3 sets - 1x daily - 7x weekly  Supine Transversus Abdominis Bracing with Double Leg Fallout - 10 reps - 2 sets - 1x daily - 7x weekly  Hooklying Small March - 10 reps - 2 sets - 1x daily - 7x weekly  Standing Shoulder Row with Anchored Resistance - 10 reps - 3 sets - 1x daily - 7x weekly  Shoulder extension with resistance - Neutral - 10 reps - 3 sets - 1x daily - 7x weekly

## 2018-10-13 ENCOUNTER — Encounter: Payer: Self-pay | Admitting: Physical Therapy

## 2018-10-13 ENCOUNTER — Other Ambulatory Visit: Payer: Self-pay

## 2018-10-13 ENCOUNTER — Ambulatory Visit: Payer: Medicaid Other | Admitting: Physical Therapy

## 2018-10-13 DIAGNOSIS — R279 Unspecified lack of coordination: Secondary | ICD-10-CM

## 2018-10-13 DIAGNOSIS — R252 Cramp and spasm: Secondary | ICD-10-CM | POA: Diagnosis not present

## 2018-10-13 DIAGNOSIS — M6281 Muscle weakness (generalized): Secondary | ICD-10-CM

## 2018-10-13 NOTE — Therapy (Signed)
[facility] [address] [place], [place], [address] Phone: [phone]   Fax:  [phone]  Physical Therapy Treatment  Patient Details  Name: Elaine Hebert MRN: [id number] Date of Birth: 08-30-1995 Referring Provider (PT): Ardis Hughs, MD   Encounter Date: 10/13/2018  PT End of Session - 10/13/18 1028    Visit Number  8    Date for PT Re-Evaluation  11/17/18    Authorization Type  MCAID    PT Start Time  1019    PT Stop Time  1057    PT Time Calculation (min)  38 min    Activity Tolerance  Patient tolerated treatment well    Behavior During Therapy  [facility] for tasks assessed/performed       History reviewed. No pertinent past medical history.  History reviewed. No pertinent surgical history.  There were no vitals filed for this visit.  Subjective Assessment - 10/13/18 1103    Subjective  Doing good, no pain today    Currently in Pain?  No/denies                       [facility] Adult PT Treatment/Exercise - 10/13/18 0001      Lumbar Exercises: Stretches   Active Hamstring Stretch  Right;Left;3 reps;20 seconds    Other Lumbar Stretch Exercise  child pose fwd and side with threading arms    Other Lumbar Stretch Exercise  adductors and ITband with strap - 20 sec each      Lumbar Exercises: Aerobic   Nustep  L3 x 6 min    PT present for status update     Lumbar Exercises: Standing   Functional Squats  20 reps   holding yellow weighted ball     Lumbar Exercises: Supine   Other Supine Lumbar Exercises  lying with foam roll lengthways - alt UE overhead, alt knee drop outs, clam red band, marching - 30x each cues to keep pelvis neutral      Lumbar Exercises: Quadruped   Single Arm Raise  10 reps    Opposite Arm/Leg Raise  10 reps;Right arm/Left leg;Left arm/Right leg               PT Short Term Goals - 09/15/18 1110      PT SHORT TERM GOAL #1   Title  ind with toileting  techniques    Status  Achieved      PT SHORT TERM GOAL #2   Title  ind with initial HEP    Status  Achieved        PT Long Term Goals - 09/15/18 1111      PT LONG TERM GOAL #1   Title  Pt will be ind with HEP in order to be able to manage symptoms at home    Baseline  working on learning more but it is helping    Status  On-going      PT LONG TERM GOAL #2   Title  Pt will report mild pain of 1-2/10 at most during all daily activities    Baseline  still can be up to 9/10 but today is 1/10; feels like 80% better since starting      PT LONG TERM GOAL #3   Title  Pt will be able to relax and bulge pelvic floor along with understanding toilet techniques in order to void without increased pain    Baseline  burning with urnination still happens  but stream is more straight and able to relax more    Status  On-going      PT LONG TERM GOAL #4   Title  Pt will report 1/3 Marinoff scale for improved functional activities    Baseline  1/3 lately but not all the time, feels like burning    Status  On-going            Plan - 10/13/18 1058    Clinical Impression Statement  Pt tolerated exercises well today.  She was challenged with the exercises and needed to really focus on engaging the abdominal muscles to keep pelvis controlled.  She will continue to benefit from skilled PT to progress with more funcional exercises which were added during today's treatment. Added squat with weighted ball. she has to lift and carry things for her daughter and will bnefit from strengthening to continue to progress to lifting more weight.    PT Treatment/Interventions  ADLs/Self Care Home Management;Biofeedback;Cryotherapy;Electrical Stimulation;Moist Heat;Therapeutic activities;Therapeutic exercise;Neuromuscular re-education;Patient/family education;Manual techniques;Taping;Dry needling;Passive range of motion    PT Next Visit Plan  continue core and posture strengthening and flexibility, progress squat  with lifting, dead lift technique    PT Home Exercise Plan  Access Code: [email or web address]    Consulted and Agree with Plan of Care  Patient       Patient will benefit from skilled therapeutic intervention in order to improve the following deficits and impairments:  Increased muscle spasms, Decreased coordination, Decreased range of motion, Impaired flexibility, Postural dysfunction  Visit Diagnosis: Cramp and spasm  Unspecified lack of coordination  Muscle weakness (generalized)     Problem List Patient Active Problem List   Diagnosis Date Noted  . [redacted] weeks gestation of pregnancy   . Abnormal fetal ultrasound   . Encounter for fetal anatomic survey   . Pregnancy complicated by fetal neurologic abnormality   . Poor fetal growth affecting management of mother in third trimester     Junious Silk, PT 10/13/2018, 11:03 AM  [facility] [address], [address] [place], [place], [address] Phone: [phone]   Fax:  [phone]  Name: Elaine Hebert MRN: [id number] Date of Birth: [birth year]

## 2018-10-20 ENCOUNTER — Ambulatory Visit: Payer: Medicaid Other | Attending: Urology | Admitting: Physical Therapy

## 2018-10-20 ENCOUNTER — Other Ambulatory Visit: Payer: Self-pay

## 2018-10-20 ENCOUNTER — Encounter: Payer: Self-pay | Admitting: Physical Therapy

## 2018-10-20 DIAGNOSIS — R252 Cramp and spasm: Secondary | ICD-10-CM | POA: Insufficient documentation

## 2018-10-20 DIAGNOSIS — M6281 Muscle weakness (generalized): Secondary | ICD-10-CM | POA: Diagnosis present

## 2018-10-20 DIAGNOSIS — R279 Unspecified lack of coordination: Secondary | ICD-10-CM | POA: Diagnosis present

## 2018-10-20 NOTE — Therapy (Signed)
[facility] [address], [place], [place], [address] Phone: [phone]   Fax:  [phone]  Physical Therapy Treatment  Patient Details  Name: Elaine Hebert MRN: [id number] Date of Birth: Aug 03, 1995 Referring Provider (PT): Ardis Hughs, MD   Encounter Date: 10/20/2018  PT End of Session - 10/20/18 1042    Visit Number  9    Date for PT Re-Evaluation  11/17/18    Authorization Type  MCAID    PT Start Time  1021    PT Stop Time  1059    PT Time Calculation (min)  38 min    Activity Tolerance  Patient tolerated treatment well    Behavior During Therapy  [facility] for tasks assessed/performed       History reviewed. No pertinent past medical history.  History reviewed. No pertinent surgical history.  There were no vitals filed for this visit.  Subjective Assessment - 10/20/18 1026    Subjective  No pain today.  I had a loose BM and I don't know what I did but there was a lot of blood the next time I had a BM.    Pertinent History  goes by Elaine Hebert    Patient Stated Goals  stop having pain and be able to work and be intimate with her partner    Currently in Pain?  No/denies                       [facility] Adult PT Treatment/Exercise - 10/20/18 0001      Lumbar Exercises: Aerobic   Nustep  L3 x 10 min intervals 20 sec slow 40 sec fast for the last 4 min   PT present for status update     Lumbar Exercises: Standing   Functional Squats  20 reps   holding 3lb weights - cues for correct form   Forward Lunge  20 reps   with sliders   Side Lunge  20 reps   with sliders   Other Standing Lumbar Exercises  walking with sports cord - needed a lot of cues to prevent pelvic drop and excess hip IR when walking out and back - 20lb 5x      Lumbar Exercises: Supine   Other Supine Lumbar Exercises  lying on foam - UE holding 3lb - press up and and alt UE 20x, alt knee fall outs, marches - 20 x each                PT Short Term Goals - 09/15/18 1110      PT SHORT TERM GOAL #1   Title  ind with toileting techniques    Status  Achieved      PT SHORT TERM GOAL #2   Title  ind with initial HEP    Status  Achieved        PT Long Term Goals - 10/20/18 1044      PT LONG TERM GOAL #1   Title  Pt will be ind with HEP in order to be able to manage symptoms at home    Baseline  able to manage 80% of the time      PT LONG TERM GOAL #2   Title  Pt will report mild pain of 1-2/10 at most during all daily activities    Baseline  not as intense - down to 7/10 from 10/10    Status  Partially Met      PT LONG  TERM GOAL #3   Title  Pt will be able to relax and bulge pelvic floor along with understanding toilet techniques in order to void without increased pain      PT LONG TERM GOAL #4   Title  Pt will report 1/3 Marinoff scale for improved functional activities            Plan - 10/20/18 1126    Clinical Impression Statement  Pt has improved 80% overall.  She is have less frequent and less intense pain.  Pt is doing better with squat technique still needs minimal cues.  Walking with sports cord was challenging for patient and needed cues throughout this activities to prevent her from walking with Trendelenburg type of gait and preveent excess hip IR.    PT Treatment/Interventions  ADLs/Self Care Home Management;Biofeedback;Cryotherapy;Electrical Stimulation;Moist Heat;Therapeutic activities;Therapeutic exercise;Neuromuscular re-education;Patient/family education;Manual techniques;Taping;Dry needling;Passive range of motion    PT Next Visit Plan  continue core and posture strengthening and flexibility, progress squat with lifting, dead lift technique    PT Home Exercise Plan  Access Code: [email or web address]    Consulted and Agree with Plan of Care  Patient       Patient will benefit from skilled therapeutic intervention in order to improve the following deficits and impairments:   Increased muscle spasms, Decreased coordination, Decreased range of motion, Impaired flexibility, Postural dysfunction  Visit Diagnosis: Cramp and spasm  Unspecified lack of coordination  Muscle weakness (generalized)     Problem List Patient Active Problem List   Diagnosis Date Noted  . [redacted] weeks gestation of pregnancy   . Abnormal fetal ultrasound   . Encounter for fetal anatomic survey   . Pregnancy complicated by fetal neurologic abnormality   . Poor fetal growth affecting management of mother in third trimester     Jule Ser, PT 10/20/2018, 11:29 AM  [facility] [address] [place], [place], [address] Phone: [phone]   Fax:  [phone]  Name: Elaine Hebert MRN: [id number] Date of Birth: [birth year]

## 2018-10-27 ENCOUNTER — Ambulatory Visit: Payer: Medicaid Other | Admitting: Physical Therapy

## 2018-10-27 ENCOUNTER — Other Ambulatory Visit: Payer: Self-pay

## 2018-10-27 ENCOUNTER — Encounter: Payer: Self-pay | Admitting: Physical Therapy

## 2018-10-27 DIAGNOSIS — R252 Cramp and spasm: Secondary | ICD-10-CM

## 2018-10-27 DIAGNOSIS — M6281 Muscle weakness (generalized): Secondary | ICD-10-CM

## 2018-10-27 DIAGNOSIS — R279 Unspecified lack of coordination: Secondary | ICD-10-CM

## 2018-10-27 NOTE — Therapy (Signed)
[facility] [address] [place], [place], [address] Phone: [phone]   Fax:  [phone]  Physical Therapy Treatment  Patient Details  Name: Elaine Hebert MRN: [id number] Date of Birth: 29-Jul-1995 Referring Provider (PT): Ardis Hughs, MD   Encounter Date: 10/27/2018  PT End of Session - 10/27/18 1022    Visit Number  10    Date for PT Re-Evaluation  11/17/18    Authorization Type  MCAID    PT Start Time  4034    PT Stop Time  1058    PT Time Calculation (min)  40 min    Activity Tolerance  Patient tolerated treatment well    Behavior During Therapy  [facility] for tasks assessed/performed       History reviewed. No pertinent past medical history.  History reviewed. No pertinent surgical history.  There were no vitals filed for this visit.  Subjective Assessment - 10/27/18 1216    Subjective  I had a good week, no pain    Patient Stated Goals  stop having pain and be able to work and be intimate with her partner    Currently in Pain?  No/denies                       [facility] Adult PT Treatment/Exercise - 10/27/18 0001      Neuro Re-ed    Neuro Re-ed Details   needed a lot of cues for functional exercises to brace core and keep spine straight      Lumbar Exercises: Stretches   Active Hamstring Stretch  Right;Left;3 reps;20 seconds    ITB Stretch  Right;Left;1 rep;60 seconds    Other Lumbar Stretch Exercise  hip adductor stretch 1 x 60 sec bilat      Lumbar Exercises: Aerobic   Elliptical  3x3 min L3; Incline 6      Lumbar Exercises: Standing   Forward Lunge  20 reps   stationary   Other Standing Lumbar Exercises  dead lift and squats with 4lb; single leg dead lift 4 lb    Other Standing Lumbar Exercises  standing hip abduction and ext on foam mat - 2 x 10               PT Short Term Goals - 09/15/18 1110      PT SHORT TERM GOAL #1   Title  ind with toileting techniques     Status  Achieved      PT SHORT TERM GOAL #2   Title  ind with initial HEP    Status  Achieved        PT Long Term Goals - 10/27/18 1024      PT LONG TERM GOAL #1   Title  Pt will be ind with HEP in order to be able to manage symptoms at home    Status  On-going      PT LONG TERM GOAL #2   Title  Pt will report mild pain of 1-2/10 at most during all daily activities    Baseline  had a really good week this week    Status  On-going      PT LONG TERM GOAL #3   Title  Pt will be able to relax and bulge pelvic floor along with understanding toilet techniques in order to void without increased pain    Baseline  no pain this week    Status  Achieved  PT LONG TERM GOAL #4   Title  Pt will report 1/3 Marinoff scale for improved functional activities    Status  On-going            Plan - 10/27/18 1029    Clinical Impression Statement  Pt reports improvements this week with reduced pain.  pt still lacks some core strength and coordination with bending and lifting activities.  She needs verbal and tactile cues to maintain neutral pelvis and spine when doing dead lift and squat techniques.  Pt will benefit from skilled PT to continue working on these functional movements in order to improve her ability to perform functional tasks    PT Treatment/Interventions  ADLs/Self Care Home Management;Biofeedback;Cryotherapy;Electrical Stimulation;Moist Heat;Therapeutic activities;Therapeutic exercise;Neuromuscular re-education;Patient/family education;Manual techniques;Taping;Dry needling;Passive range of motion    PT Next Visit Plan  continue core and posture strengthening and flexibility, progress squat with lifting, dead lift technique    PT Home Exercise Plan  Access Code: [email or web address]    Consulted and Agree with Plan of Care  Patient       Patient will benefit from skilled therapeutic intervention in order to improve the following deficits and impairments:  Increased muscle spasms,  Decreased coordination, Decreased range of motion, Impaired flexibility, Postural dysfunction  Visit Diagnosis: Cramp and spasm  Unspecified lack of coordination  Muscle weakness (generalized)     Problem List Patient Active Problem List   Diagnosis Date Noted  . [redacted] weeks gestation of pregnancy   . Abnormal fetal ultrasound   . Encounter for fetal anatomic survey   . Pregnancy complicated by fetal neurologic abnormality   . Poor fetal growth affecting management of mother in third trimester     Junious Silk, PT 10/27/2018, 12:20 PM  [facility] [address] [place], [place], [address] Phone: [phone]   Fax:  [phone]  Name: Elaine Hebert MRN: [id number] Date of Birth: [birth year]

## 2018-11-03 ENCOUNTER — Encounter: Payer: Self-pay | Admitting: Physical Therapy

## 2018-11-03 ENCOUNTER — Other Ambulatory Visit: Payer: Self-pay

## 2018-11-03 ENCOUNTER — Ambulatory Visit: Payer: Medicaid Other | Admitting: Physical Therapy

## 2018-11-03 DIAGNOSIS — M6281 Muscle weakness (generalized): Secondary | ICD-10-CM

## 2018-11-03 DIAGNOSIS — R279 Unspecified lack of coordination: Secondary | ICD-10-CM

## 2018-11-03 DIAGNOSIS — R252 Cramp and spasm: Secondary | ICD-10-CM

## 2018-11-03 NOTE — Therapy (Signed)
[facility] [address] [place], [place], [address] Phone: [phone]   Fax:  [phone]  Physical Therapy Treatment  Patient Details  Name: Elaine Hebert MRN: [id number] Date of Birth: [birth year] Referring Provider (PT): Crist Fat, MD   Encounter Date: 11/03/2018  PT End of Session - 11/03/18 1057    Visit Number  11    Date for PT Re-Evaluation  11/17/18    Authorization Type  MCA[employer]    PT Start Time  1024    PT Stop Time  1102    PT Time Calculation (min)  38 min    Activity Tolerance  Patient tolerated treatment well    Behavior During Therapy  [facility] for tasks assessed/performed       History reviewed. No pertinent past medical history.  History reviewed. No pertinent surgical history.  There were no vitals filed for this visit.  Subjective Assessment - 11/03/18 1032    Subjective  No changes still doing good.  I just want to make sure I have everything to do the exericses at home.    Pertinent History  goes by Elaine Hebert    Patient Stated Goals  stop having pain and be able to work and be intimate with her partner    Currently in Pain?  No/denies                       [facility] Adult PT Treatment/Exercise - 11/03/18 0001      Lumbar Exercises: Stretches   Active Hamstring Stretch  Right;Left;3 reps;20 seconds    Figure 4 Stretch  2 reps;30 seconds;Seated      Lumbar Exercises: Aerobic   Elliptical  L3 ; I6 x 7 min PT present for status      Lumbar Exercises: Machines for Strengthening   Leg Press  seat 6 80# bilat x 30      Lumbar Exercises: Standing   Other Standing Lumbar Exercises  pallof punch and pallof squat - blue band    Other Standing Lumbar Exercises  up and over and LE 3 ways on BOSU               PT Short Term Goals - 09/15/18 1110      PT SHORT TERM GOAL #1   Title  ind with toileting techniques    Status  Achieved      PT SHORT TERM GOAL #2   Title  ind with initial HEP    Status  Achieved        PT Long Term Goals - 11/03/18 1056      PT LONG TERM GOAL #1   Title  Pt will be ind with HEP in order to be able to manage symptoms at home    Status  On-going      PT LONG TERM GOAL #2   Title  Pt will report mild pain of 1-2/10 at most during all daily activities    Baseline  no change from last week            Plan - 11/03/18 1037    Clinical Impression Statement  Pt is doing well with her pain levels and has been consistent with less pain.  She still needs some help knowing how to progress her HEP at this time and making sure she is confident to continue on her own.  She needs cues on hip and knee alignment with leg press and standing  exercises    PT Treatment/Interventions  ADLs/Self Care Home Management;Biofeedback;Cryotherapy;Electrical Stimulation;Moist Heat;Therapeutic activities;Therapeutic exercise;Neuromuscular re-education;Patient/family education;Manual techniques;Taping;Dry needling;Passive range of motion    PT Home Exercise Plan  Access Code: [email or web address]    Consulted and Agree with Plan of Care  Patient       Patient will benefit from skilled therapeutic intervention in order to improve the following deficits and impairments:  Increased muscle spasms, Decreased coordination, Decreased range of motion, Impaired flexibility, Postural dysfunction  Visit Diagnosis: Cramp and spasm  Unspecified lack of coordination  Muscle weakness (generalized)     Problem List Patient Active Problem List   Diagnosis Date Noted  . [redacted] weeks gestation of pregnancy   . Abnormal fetal ultrasound   . Encounter for fetal anatomic survey   . Pregnancy complicated by fetal neurologic abnormality   . Poor fetal growth affecting management of mother in third trimester     Jule Ser, PT 11/03/2018, 11:03 AM  [facility] [address], [place],  [place], [address] Phone: [phone]   Fax:  [phone]  Name: Elaine Hebert MRN: [id number] Date of Birth: 1995/03/22

## 2018-11-10 ENCOUNTER — Ambulatory Visit: Payer: Medicaid Other | Admitting: Physical Therapy

## 2018-11-16 ENCOUNTER — Encounter: Payer: Self-pay | Admitting: Physical Therapy

## 2018-11-16 ENCOUNTER — Other Ambulatory Visit: Payer: Self-pay

## 2018-11-16 ENCOUNTER — Ambulatory Visit: Payer: Medicaid Other | Admitting: Physical Therapy

## 2018-11-16 DIAGNOSIS — R279 Unspecified lack of coordination: Secondary | ICD-10-CM

## 2018-11-16 DIAGNOSIS — R252 Cramp and spasm: Secondary | ICD-10-CM

## 2018-11-16 DIAGNOSIS — M6281 Muscle weakness (generalized): Secondary | ICD-10-CM

## 2018-11-16 NOTE — Therapy (Signed)
[facility] [address] [place], [place], [address] Phone: [phone]   Fax:  [phone]  Physical Therapy Treatment  Patient Details  Name: Elaine Hebert MRN: [id number] Date of Birth: 01-Aug-1995 Referring Provider (PT): Ardis Hughs, MD   Encounter Date: 11/16/2018  PT End of Session - 11/16/18 1252    Visit Number  12    Date for PT Re-Evaluation  11/17/18    Authorization Type  MCAID    PT Start Time  6962   pt arrived late   PT Stop Time  1311    PT Time Calculation (min)  26 min    Activity Tolerance  Patient tolerated treatment well    Behavior During Therapy  [facility] for tasks assessed/performed       History reviewed. No pertinent past medical history.  History reviewed. No pertinent surgical history.  There were no vitals filed for this visit.  Subjective Assessment - 11/16/18 1250    Subjective  No changes, still doing well.  I am going to join the gym near my house.    Pertinent History  goes by Elaine Hebert    Limitations  Sitting    Patient Stated Goals  stop having pain and be able to work and be intimate with her partner    Currently in Pain?  No/denies                       [facility] Adult PT Treatment/Exercise - 11/16/18 0001      Self-Care   Other Self-Care Comments   reviewed and discussed plan for the gym and to continue with HEP      Lumbar Exercises: Machines for Strengthening   Leg Press  seat 6 90 # bilat x 30; single leg 45# 3x10      Lumbar Exercises: Standing   Other Standing Lumbar Exercises  core engaged and hopping on rebounder 3 ways x 1 min each               PT Short Term Goals - 09/15/18 1110      PT SHORT TERM GOAL #1   Title  ind with toileting techniques    Status  Achieved      PT SHORT TERM GOAL #2   Title  ind with initial HEP    Status  Achieved        PT Long Term Goals - 11/16/18 1253      PT LONG TERM GOAL #1   Title   Pt will be ind with HEP in order to be able to manage symptoms at home    Baseline  doing well at symptom management and will be ind with HEP    Status  Achieved      PT LONG TERM GOAL #2   Title  Pt will report mild pain of 1-2/10 at most during all daily activities    Status  Achieved      PT LONG TERM GOAL #3   Title  Pt will be able to relax and bulge pelvic floor along with understanding toilet techniques in order to void without increased pain    Status  Achieved      PT LONG TERM GOAL #4   Title  Pt will report 1/3 Marinoff scale for improved functional activities    Baseline  1/3 can work around the pain    Status  Achieved  Plan - 11/16/18 1315    Clinical Impression Statement  Pt has met all goals and is currently independent with HEP.  Pt will benefit from discharge with HEP    PT Treatment/Interventions  ADLs/Self Care Home Management;Biofeedback;Cryotherapy;Electrical Stimulation;Moist Heat;Therapeutic activities;Therapeutic exercise;Neuromuscular re-education;Patient/family education;Manual techniques;Taping;Dry needling;Passive range of motion    PT Home Exercise Plan  Access Code: [email or web address]    Consulted and Agree with Plan of Care  Patient       Patient will benefit from skilled therapeutic intervention in order to improve the following deficits and impairments:  Increased muscle spasms, Decreased coordination, Decreased range of motion, Impaired flexibility, Postural dysfunction  Visit Diagnosis: Cramp and spasm  Unspecified lack of coordination  Muscle weakness (generalized)     Problem List Patient Active Problem List   Diagnosis Date Noted  . [redacted] weeks gestation of pregnancy   . Abnormal fetal ultrasound   . Encounter for fetal anatomic survey   . Pregnancy complicated by fetal neurologic abnormality   . Poor fetal growth affecting management of mother in third trimester     Jule Ser, PT 11/16/2018, 1:16 PM  [facility] [address] [place], [place], [address] Phone: [phone]   Fax:  [phone]  Name: Elaine Hebert MRN: [id number] Date of Birth: [birth year]  PHYSICAL THERAPY DISCHARGE SUMMARY  Visits from Start of Care: 12  Current functional level related to goals / functional outcomes: See above   Remaining deficits: See above   Education / Equipment: HEP  Plan: Patient agrees to discharge.  Patient goals were met. Patient is being discharged due to meeting the stated rehab goals.  ?????    [employer], PT 11/16/18 1:18 PM

## 2018-11-17 ENCOUNTER — Encounter: Payer: Medicaid Other | Admitting: Physical Therapy
